# Patient Record
Sex: Male | Born: 1995 | Race: Black or African American | Hispanic: No | Marital: Single
Health system: Southern US, Community
[De-identification: ages and names within clinical notes are randomized; demographics above are authoritative.]

## PROBLEM LIST (undated history)

## (undated) DIAGNOSIS — M928 Other specified juvenile osteochondrosis: Secondary | ICD-10-CM

## (undated) HISTORY — DX: Other specified juvenile osteochondrosis: M92.8

---
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
## 2014-01-03 ENCOUNTER — Encounter (HOSPITAL_COMMUNITY): Payer: Self-pay | Admitting: Emergency Medicine

## 2014-01-03 ENCOUNTER — Emergency Department (HOSPITAL_COMMUNITY)
Admission: EM | Admit: 2014-01-03 | Discharge: 2014-01-03 | Disposition: A | Discharge status: Home / Self Care | Payer: Medicaid Other | Attending: Emergency Medicine | Admitting: Emergency Medicine

## 2014-01-03 DIAGNOSIS — S01511A Laceration without foreign body of lip, initial encounter: Secondary | ICD-10-CM

## 2014-01-03 DIAGNOSIS — M5412 Radiculopathy, cervical region: Secondary | ICD-10-CM | POA: Insufficient documentation

## 2014-01-03 DIAGNOSIS — S01501A Unspecified open wound of lip, initial encounter: Secondary | ICD-10-CM | POA: Insufficient documentation

## 2014-01-03 DIAGNOSIS — Y9367 Activity, basketball: Secondary | ICD-10-CM | POA: Insufficient documentation

## 2014-01-03 DIAGNOSIS — Y92838 Other recreation area as the place of occurrence of the external cause: Secondary | ICD-10-CM

## 2014-01-03 DIAGNOSIS — W219XXA Striking against or struck by unspecified sports equipment, initial encounter: Secondary | ICD-10-CM | POA: Insufficient documentation

## 2014-01-03 DIAGNOSIS — Y9239 Other specified sports and athletic area as the place of occurrence of the external cause: Secondary | ICD-10-CM | POA: Insufficient documentation

## 2014-01-03 DIAGNOSIS — F17200 Nicotine dependence, unspecified, uncomplicated: Secondary | ICD-10-CM | POA: Insufficient documentation

## 2014-01-03 MED ORDER — DOUBLE ANTIBIOTIC 500-10000 UNIT/GM EX OINT: TOPICAL_OINTMENT | Freq: Once | CUTANEOUS | Status: DC

## 2014-01-03 MED ORDER — BACITRACIN ZINC 500 UNIT/GM EX OINT
TOPICAL_OINTMENT | CUTANEOUS | Status: AC
  Administered 2014-01-03: 1
  Filled 2014-01-03: qty 0.9

## 2014-01-03 NOTE — ED Provider Notes (Signed)
CSN: [id number]     Arrival date & time 01/03/14  1620 History   First MD Initiated Contact with Patient 01/03/14 1630     Chief Complaint  Patient presents with  . Laceration     (Consider location/radiation/quality/duration/timing/severity/associated sxs/prior Treatment) Patient is a 18 y.o. male presenting with skin laceration. The history is provided by the patient.  Laceration Location:  Mouth Mouth laceration location:  Lower inner lip Length (cm):  1.2 cm Quality: jagged   Bleeding: controlled   Time since incident:  1 hour Injury mechanism: teeth. Pain details:    Quality:  Aching   Severity:  Mild   Timing:  Constant   Progression:  Unchanged Foreign body present:  No foreign bodies Worsened by:  Nothing tried Tetanus status:  Up to date  Zachary Cain is a 18 y.o. male who presents to the ED for a laceration to the lower lip. He was playing basketball and hit his lip against another player's head and patient's teeth cut bottom lip. There was bleeding initially but it stopped. There is some swelling. No injury to the teeth, no head injury, no LOC, no n/v. He does have a little discomfort in his lower back but did not get hit in his back.   Past Medical History  Diagnosis Date  . Osgood-Schlatter/osteochondroses    History reviewed. No pertinent past surgical history. No family history on file. History  Substance Use Topics  . Smoking status: Light Tobacco Smoker    Types: Cigarettes  . Smokeless tobacco: Not on file     Comment: somes occasionally  . Alcohol Use: No    Review of Systems Negative except as stated in HPI   Allergies  Review of patient's allergies indicates no known allergies.  Home Medications  No current outpatient prescriptions on file. BP 121/70  Pulse 69  Temp(Src) 98 F (36.7 C)  Resp 18  Ht 5\' 10"  (1.778 m)  Wt 147 lb (66.679 kg)  BMI 21.09 kg/m2  SpO2 98% Physical Exam  Nursing note and vitals reviewed. Constitutional: He  is oriented to person, place, and time. He appears well-developed and well-nourished. No distress.  HENT:  Right Ear: Tympanic membrane normal.  Left Ear: Tympanic membrane normal.  Mouth/Throat: Uvula is midline, oropharynx is clear and moist and mucous membranes are normal. Normal dentition. Lacerations present.  There is a 1.2 cm laceration to the inside of the lower lip that extends to the outside. The laceration does not involve the vermilion border.   Eyes: Conjunctivae and EOM are normal. Pupils are equal, round, and reactive to light.  Neck: Normal range of motion. Neck supple. No spinous process tenderness and no muscular tenderness present.  Cardiovascular: Normal rate.   Pulmonary/Chest: Effort normal.  Abdominal: Soft.  Musculoskeletal: Normal range of motion.  Minimal tenderness right lower lumbar area with palpation.   Neurological: He is alert and oriented to person, place, and time. He has normal strength. Gait normal.  Skin: Skin is warm and dry.  Psychiatric: He has a normal mood and affect. His behavior is normal.   I discussed with the patient's grandmother that sutures would most likely not help the area since it is mostly tissue damage and no real area to pull together. She is concerned that it may do better with sutures.  ED Course: dr. Adriana Simasook in to examine the patient.   Procedures Dr. Adriana Simasook discussed with the patient's grandmother type of injury and suggested bacitracin ointment and ice. Patient's  grandmother agrees with Dr. Adriana Simas.   MDM  18 y.o. male with laceration to the lower lip s/p sport injury. Wound cleaned, bacitracin ointment applied to outside part of lip. Stable for discharge. Will not suture the lip. Discussed with the patient and his grandmother plan of care and all questioned fully answered. He will return if any problems arise.     [facility] Orlene Och, [place] 01/03/14 [phone]

## 2014-01-03 NOTE — ED Notes (Signed)
Laceration to lower lip.

## 2014-01-03 NOTE — ED Notes (Signed)
Split bottom lip while playing basketball about 30 mins ago.

## 2014-01-03 NOTE — ED Provider Notes (Signed)
Medical screening examination/treatment/procedure(s) were conducted as a shared visit with non-physician practitioner(s) and myself.  I personally evaluated the patient during the encounter.   EKG Interpretation None     Lower lip laceration macerated.  No suture necessary  Donnetta HutchingBrian Timoteo Carreiro, MD 01/03/14 1715

## 2014-01-09 IMAGING — CR DG CHEST 2V
2 series · 2 of 2 positions shown · non-contrast
Comparison: Chest and rib films 07/29/2010.

CLINICAL DATA: Cough.

CHEST - 2 VIEW

[view not recorded (1 of 2)]
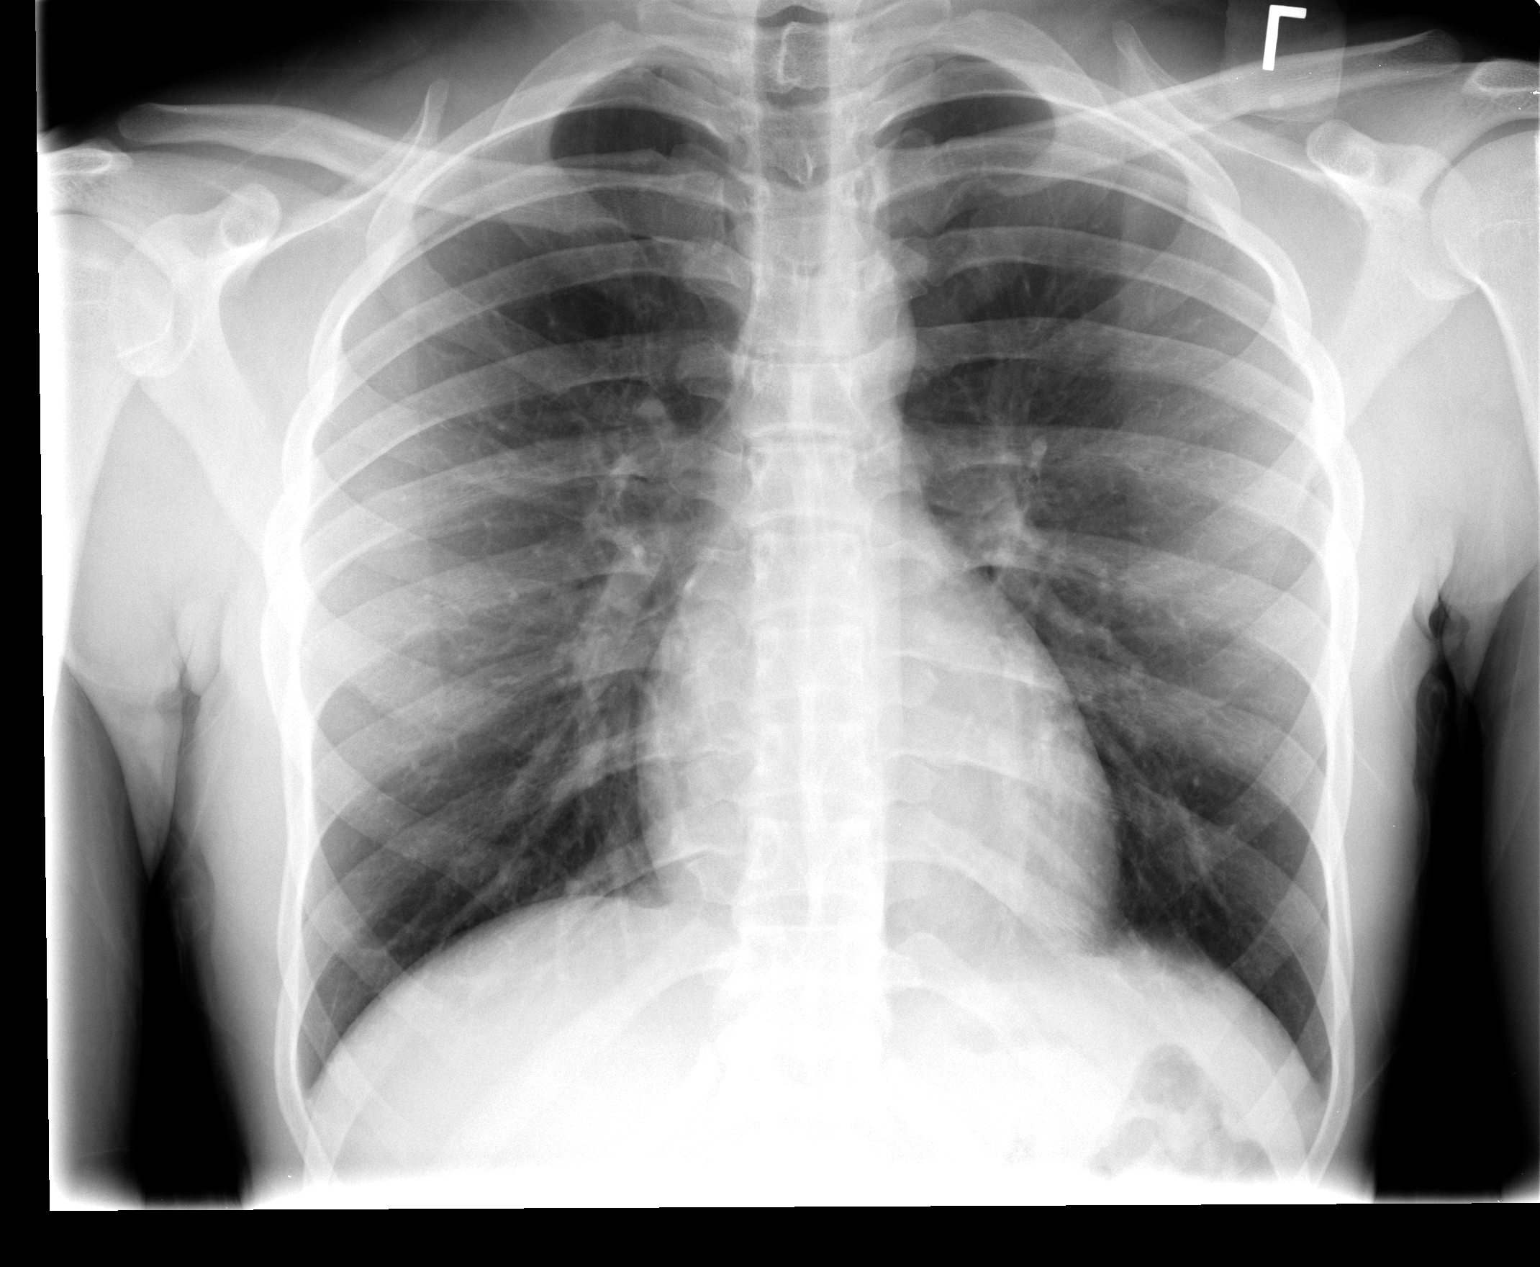

[view not recorded (2 of 2)]
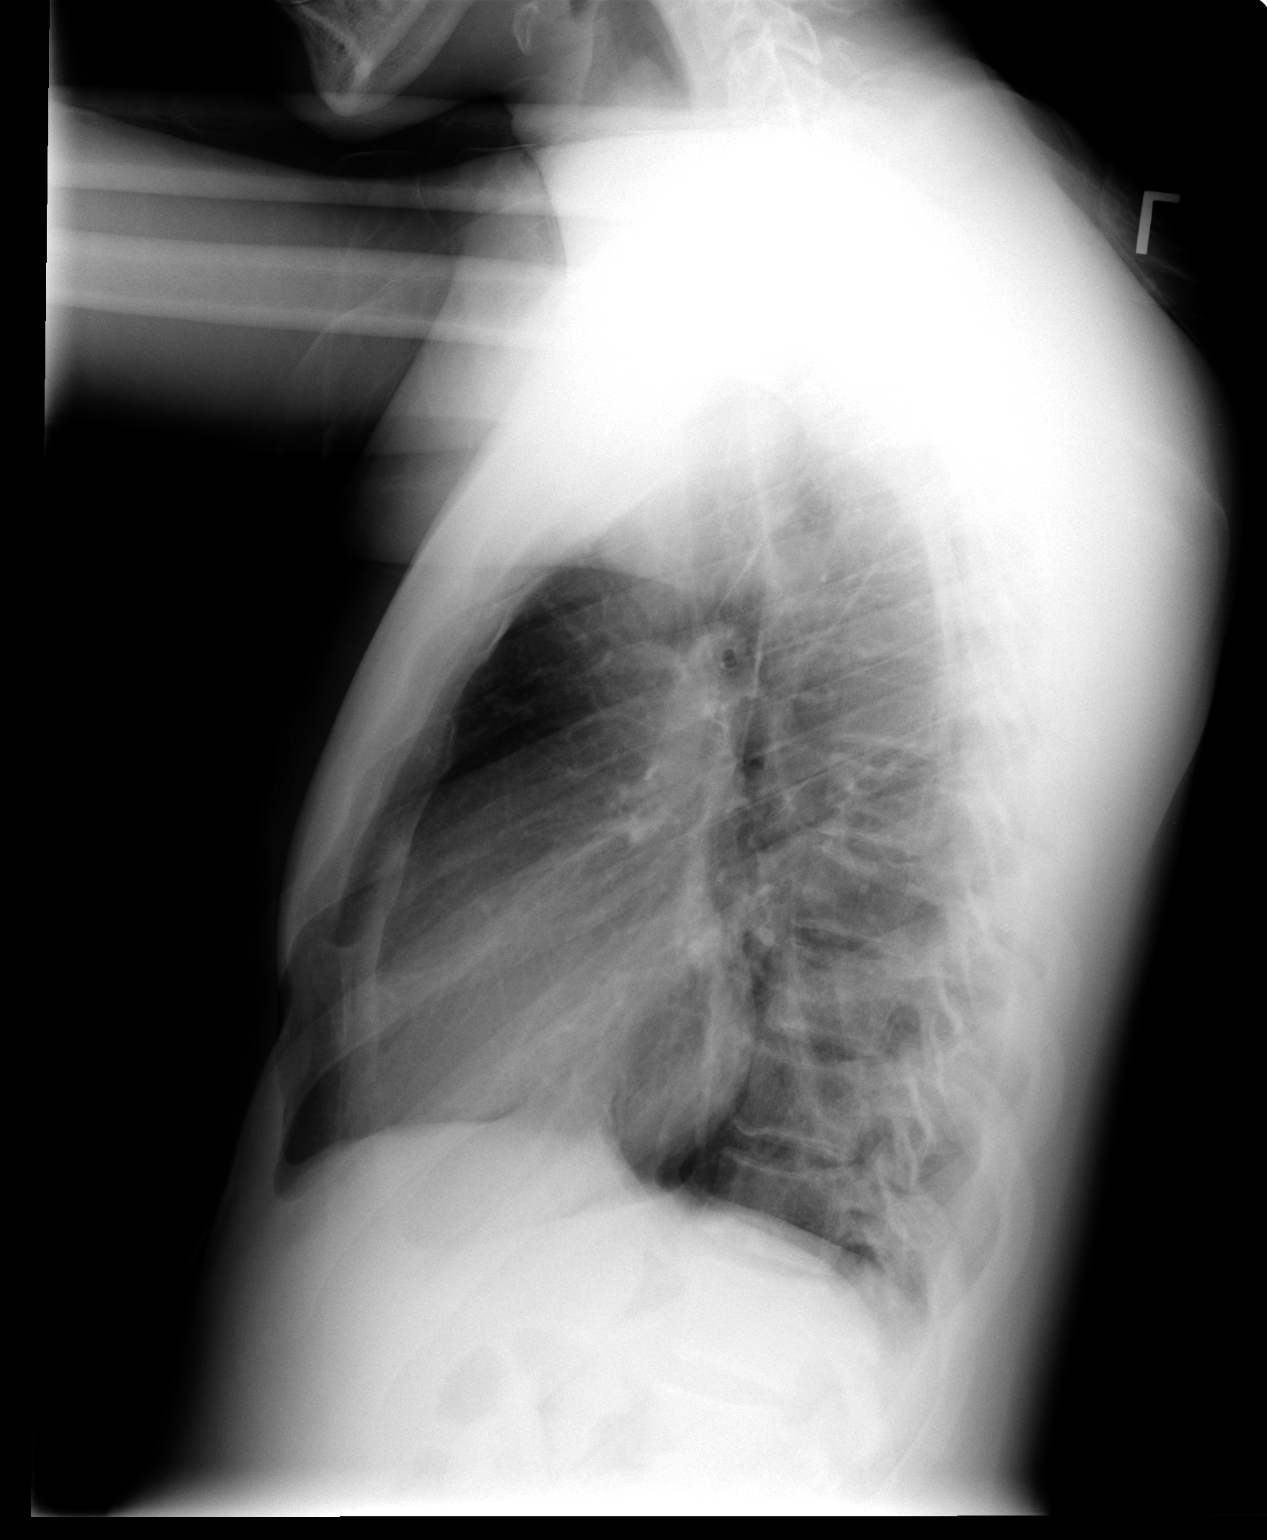

[2 of 2 positions shown; findings below may reference images not displayed]

FINDINGS: The heart size is normal.  No acute bone or soft tissue
abnormality is present.
IMPRESSION: Negative chest.

## 2015-03-04 ENCOUNTER — Emergency Department (HOSPITAL_COMMUNITY): Payer: Medicaid Other

## 2015-03-04 ENCOUNTER — Emergency Department (HOSPITAL_COMMUNITY)
Admission: EM | Admit: 2015-03-04 | Discharge: 2015-03-04 | Disposition: A | Discharge status: Home / Self Care | Payer: Medicaid Other | Attending: Emergency Medicine | Admitting: Emergency Medicine

## 2015-03-04 ENCOUNTER — Encounter (HOSPITAL_COMMUNITY): Payer: Self-pay | Admitting: Emergency Medicine

## 2015-03-04 DIAGNOSIS — R51 Headache: Secondary | ICD-10-CM | POA: Insufficient documentation

## 2015-03-04 DIAGNOSIS — R0789 Other chest pain: Secondary | ICD-10-CM

## 2015-03-04 DIAGNOSIS — R059 Cough, unspecified: Secondary | ICD-10-CM

## 2015-03-04 DIAGNOSIS — R369 Urethral discharge, unspecified: Secondary | ICD-10-CM

## 2015-03-04 DIAGNOSIS — R112 Nausea with vomiting, unspecified: Secondary | ICD-10-CM

## 2015-03-04 DIAGNOSIS — Z8739 Personal history of other diseases of the musculoskeletal system and connective tissue: Secondary | ICD-10-CM | POA: Insufficient documentation

## 2015-03-04 DIAGNOSIS — R05 Cough: Secondary | ICD-10-CM | POA: Insufficient documentation

## 2015-03-04 DIAGNOSIS — Z720 Tobacco use: Secondary | ICD-10-CM | POA: Insufficient documentation

## 2015-03-04 DIAGNOSIS — R519 Headache, unspecified: Secondary | ICD-10-CM

## 2015-03-04 LAB — URINALYSIS, ROUTINE W REFLEX MICROSCOPIC
Bilirubin Urine: NEGATIVE
Glucose, UA: NEGATIVE mg/dL
Hgb urine dipstick: NEGATIVE
KETONES UR: NEGATIVE mg/dL
NITRITE: NEGATIVE
PROTEIN: NEGATIVE mg/dL
Specific Gravity, Urine: >1.03 — ABNORMAL HIGH (ref 1.005–1.030)
UROBILINOGEN UA: 0.2 mg/dL (ref 0.0–1.0)
pH: 6 (ref 5.0–8.0)

## 2015-03-04 LAB — URINE MICROSCOPIC-ADD ON

## 2015-03-04 MED ORDER — KETOROLAC TROMETHAMINE 30 MG/ML IJ SOLN
60.0000 mg | Freq: Once | INTRAMUSCULAR | Status: AC
  Filled 2015-03-04: qty 2

## 2015-03-04 MED ORDER — CEPHALEXIN 500 MG PO CAPS: 500.0000 mg | ORAL_CAPSULE | Freq: Two times a day (BID) | ORAL | Status: DC

## 2015-03-04 MED ORDER — IBUPROFEN 800 MG PO TABS: 800.0000 mg | ORAL_TABLET | Freq: Three times a day (TID) | ORAL | Status: DC | PRN

## 2015-03-04 MED ORDER — LIDOCAINE HCL (PF) 1 % IJ SOLN
INTRAMUSCULAR | Status: AC
  Administered 2015-03-04: 5 mL
  Filled 2015-03-04: qty 5

## 2015-03-04 MED ORDER — KETOROLAC TROMETHAMINE 60 MG/2ML IM SOLN
60.0000 mg | Freq: Once | INTRAMUSCULAR | Status: AC
  Administered 2015-03-04: 60 mg via INTRAMUSCULAR

## 2015-03-04 MED ORDER — CEFTRIAXONE SODIUM 250 MG IJ SOLR
250.0000 mg | Freq: Once | INTRAMUSCULAR | Status: AC
  Administered 2015-03-04: 250 mg via INTRAMUSCULAR
  Filled 2015-03-04: qty 250

## 2015-03-04 MED ORDER — ONDANSETRON 4 MG PO TBDP: 4.0000 mg | ORAL_TABLET | Freq: Three times a day (TID) | ORAL | Status: DC | PRN

## 2015-03-04 MED ORDER — AZITHROMYCIN 250 MG PO TABS
1000.0000 mg | ORAL_TABLET | Freq: Once | ORAL | Status: AC
  Administered 2015-03-04: 1000 mg via ORAL
  Filled 2015-03-04: qty 4

## 2015-03-04 MED ORDER — ONDANSETRON 8 MG PO TBDP
8.0000 mg | ORAL_TABLET | Freq: Once | ORAL | Status: AC
  Administered 2015-03-04: 8 mg via ORAL
  Filled 2015-03-04: qty 1

## 2015-03-04 NOTE — ED Provider Notes (Addendum)
TIME SEEN: 4:20 AM  CHIEF COMPLAINT: Headache, cough, chest pain, hematuria, penile discharge  HPI: Pt is a 19 y.o. male with no significant past history who presents to the emergency department with multiple complaints. Reported the past several days he has had cough with yellow sputum production that is not turn into a dry cough. Reports he has had subjective fevers. Is having chest pain that is worse with this coughing. Chest pain is diffuse without radiation. Described as moderate in nature. Minimal chest pain currently. No shortness of breath. Reports that he started having a left-sided headache what work today. Did not take any medication for this because he states he had one episode of vomiting at work. Has not had head injury. No neck pain or neck stiffness. No numbness, tingling or focal weakness. Not on anticoagulation. Also reports for the past several days he has noticed blood in his urine and penile discharge. States he, his girlfriend was cheating on him. He does not have a history of STDs. Denies dysuria. No flank pain. No testicular pain or swelling. No scrotal masses.    ROS: See HPI Constitutional: Subjective fever  Eyes: no drainage  ENT: no runny nose   Cardiovascular:   chest pain  Resp: no SOB  GI: Vomiting, no diarrhea, no abdominal pain GU: no dysuria Integumentary: no rash  Allergy: no hives  Musculoskeletal: no leg swelling  Neurological: no slurred speech ROS otherwise negative  PAST MEDICAL HISTORY/PAST SURGICAL HISTORY:  Past Medical History  Diagnosis Date  . Osgood-Schlatter/osteochondroses     MEDICATIONS:  Prior to Admission medications   Not on File    ALLERGIES:  No Known Allergies  SOCIAL HISTORY:  History  Substance Use Topics  . Smoking status: Light Tobacco Smoker    Types: Cigarettes  . Smokeless tobacco: Not on file     Comment: somes occasionally  . Alcohol Use: No    FAMILY HISTORY: History reviewed. No pertinent family  history.  EXAM: BP 127/81 mmHg  Pulse 57  Temp(Src) 98.3 F (36.8 C)  Resp 18  Ht 5\' 7"  (1.702 m)  Wt 140 lb (63.504 kg)  BMI 21.92 kg/m2  SpO2 97% CONSTITUTIONAL: Alert and oriented and responds appropriately to questions. Well-appearing; well-nourished, well-hydrated, nontoxic, no distress HEAD: Normocephalic EYES: Conjunctivae clear, PERRL ENT: normal nose; no rhinorrhea; moist mucous membranes; pharynx without lesions noted NECK: Supple, no meningismus, no LAD  CARD: RRR; S1 and S2 appreciated; no murmurs, no clicks, no rubs, no gallops RESP: Normal chest excursion without splinting or tachypnea; breath sounds clear and equal bilaterally; no wheezes, no rhonchi, no rales, no hypoxia or respiratory distress, speaking full sentences, anterior chest wall is tender to palpation without crepitus or ecchymosis or deformity ABD/GI: Normal bowel sounds; non-distended; soft, non-tender, no rebound, no guarding, no peritoneal signs GU:  Normal external genitalia, circumcised male, small amount of clear discharge coming from the urethral meatus but no blood or purulence, 2+ femoral pulses bilaterally, no testicular pain or masses, no scrotal masses, no hernia, no perineal warmth or erythema or crepitus BACK:  The back appears normal and is non-tender to palpation, there is no CVA tenderness EXT: Normal ROM in all joints; non-tender to palpation; no edema; normal capillary refill; no cyanosis, no calf tenderness or swelling    SKIN: Normal color for age and race; warm NEURO: Moves all extremities equally, sensation to light touch intact diffusely, cranial nerves II through XII intact PSYCH: The patient's mood and manner are appropriate. Grooming  and personal hygiene are appropriate.  MEDICAL DECISION MAKING: Patient here with multiple complaints. EKG shows no ischemic changes. Will give dose of Toradol in the ED. We'll also obtain chest x-ray. Suspect chest pain is musculoskeletal in nature from  his cough which is likely caused by a viral illness.  Also complaining of headache. He is neurologically intact, afebrile, well-appearing, nontoxic. I do not feel he needs emergent head imaging. We'll reassess after Toradol.   Also complains of one episode of nonbloody, nonbilious vomiting. No diarrhea. No abdominal pain. We'll give Zofran. He appears well hydrated on exam.   Also complaining of hematuria, penile discharge. Will check for gonorrhea, Chlamydia and Trichomonas. Have recommended HIV and syphilis testing which he declines in the emergency department today. Will treat empirically for gonorrhea and chlamydia. Urinalysis pending.  ED PROGRESS: Chest x-ray clear. Headache improved. Able to tolerate taking fluids without further vomiting. Urine shows leukocytes and bacteria but I suspect this may be due to an STD. Urine culture is pending as well as gonorrhea/chlamydia test but he has been treated today with ceftriaxone and azithromycin empirically. Will discharge on Keflex given he is having penile discharge and hematuria in case this is a urinary tract infection. Have again advised him to follow-up as an outpatient for HIV and syphilis testing. Discussed return precautions. He verbalizes understanding and is comfortable with plan.     EKG Interpretation  Date/Time:  Tuesday Mar 04 2015 04:30:19 EDT Ventricular Rate:  56 PR Interval:  181 QRS Duration: 84 QT Interval:  396 QTC Calculation: 382 R Axis:   56 Text Interpretation:  Sinus rhythm No old tracing to compare Confirmed by WARD,  DO, KRISTEN [phone]) on 03/04/2015 4:51:26 AM          Layla Maw Ward, DO 03/04/15 0534  Layla Maw Ward, DO 03/04/15 0534  Layla Maw Ward, DO 03/04/15 6045

## 2015-03-04 NOTE — Discharge Instructions (Signed)
You have been treated for gonorrhea and chlamydia today.  Please follow-up with your primary care provider for HIV and syphilis testing.   Chest Wall Pain Chest wall pain is pain in or around the bones and muscles of your chest. It may take up to 6 weeks to get better. It may take longer if you must stay physically active in your work and activities.  CAUSES  Chest wall pain may happen on its own. However, it may be caused by:  A viral illness like the flu.  Injury.  Coughing.  Exercise.  Arthritis.  Fibromyalgia.  Shingles. HOME CARE INSTRUCTIONS   Avoid overtiring physical activity. Try not to strain or perform activities that cause pain. This includes any activities using your chest or your abdominal and side muscles, especially if heavy weights are used.  Put ice on the sore area.  Put ice in a plastic bag.  Place a towel between your skin and the bag.  Leave the ice on for 15-20 minutes per hour while awake for the first 2 days.  Only take over-the-counter or prescription medicines for pain, discomfort, or fever as directed by your caregiver. SEEK IMMEDIATE MEDICAL CARE IF:   Your pain increases, or you are very uncomfortable.  You have a fever.  Your chest pain becomes worse.  You have new, unexplained symptoms.  You have nausea or vomiting.  You feel sweaty or lightheaded.  You have a cough with phlegm (sputum), or you cough up blood. MAKE SURE YOU:   Understand these instructions.  Will watch your condition.  Will get help right away if you are not doing well or get worse. Document Released: 09/20/2005 Document Revised: 12/13/2011 Document Reviewed: 05/17/2011 [facility] Patient Information 2015 [place], [place]. This information is not intended to replace advice given to you by your health care provider. Make sure you discuss any questions you have with your health care provider.  General Headache Without Cause A headache is pain or discomfort felt  around the head or neck area. The specific cause of a headache may not be found. There are many causes and types of headaches. A few common ones are:  Tension headaches.  Migraine headaches.  Cluster headaches.  Chronic daily headaches. HOME CARE INSTRUCTIONS   Keep all follow-up appointments with your caregiver or any specialist referral.  Only take over-the-counter or prescription medicines for pain or discomfort as directed by your caregiver.  Lie down in a dark, quiet room when you have a headache.  Keep a headache journal to find out what may trigger your migraine headaches. For example, write down:  What you eat and drink.  How much sleep you get.  Any change to your diet or medicines.  Try massage or other relaxation techniques.  Put ice packs or heat on the head and neck. Use these 3 to 4 times per day for 15 to 20 minutes each time, or as needed.  Limit stress.  Sit up straight, and do not tense your muscles.  Quit smoking if you smoke.  Limit alcohol use.  Decrease the amount of caffeine you drink, or stop drinking caffeine.  Eat and sleep on a regular schedule.  Get 7 to 9 hours of sleep, or as recommended by your caregiver.  Keep lights dim if bright lights bother you and make your headaches worse. SEEK MEDICAL CARE IF:   You have problems with the medicines you were prescribed.  Your medicines are not working.  You have a change from  the usual headache.  You have nausea or vomiting. SEEK IMMEDIATE MEDICAL CARE IF:   Your headache becomes severe.  You have a fever.  You have a stiff neck.  You have loss of vision.  You have muscular weakness or loss of muscle control.  You start losing your balance or have trouble walking.  You feel faint or pass out.  You have severe symptoms that are different from your first symptoms. MAKE SURE YOU:   Understand these instructions.  Will watch your condition.  Will get help right away if you  are not doing well or get worse. Document Released: 09/20/2005 Document Revised: 12/13/2011 Document Reviewed: 10/06/2011 [facility] Patient Information 2015 [place], [place]. This information is not intended to replace advice given to you by your health care provider. Make sure you discuss any questions you have with your health care provider.   Sexually Transmitted Disease A sexually transmitted disease (STD) is a disease or infection that may be passed (transmitted) from person to person, usually during sexual activity. This may happen by way of saliva, semen, blood, vaginal mucus, or urine. Common STDs include:   Gonorrhea.   Chlamydia.   Syphilis.   HIV and AIDS.   Genital herpes.   Hepatitis B and C.   Trichomonas.   Human papillomavirus (HPV).   Pubic lice.   Scabies.  Mites.  Bacterial vaginosis. WHAT ARE CAUSES OF STDs? An STD may be caused by bacteria, a virus, or parasites. STDs are often transmitted during sexual activity if one person is infected. However, they may also be transmitted through nonsexual means. STDs may be transmitted after:   Sexual intercourse with an infected person.   Sharing sex toys with an infected person.   Sharing needles with an infected person or using unclean piercing or tattoo needles.  Having intimate contact with the genitals, mouth, or rectal areas of an infected person.   Exposure to infected fluids during birth. WHAT ARE THE SIGNS AND SYMPTOMS OF STDs? Different STDs have different symptoms. Some people may not have any symptoms. If symptoms are present, they may include:   Painful or bloody urination.   Pain in the pelvis, abdomen, vagina, anus, throat, or eyes.   A skin rash, itching, or irritation.  Growths, ulcerations, blisters, or sores in the genital and anal areas.  Abnormal vaginal discharge with or without bad odor.   Penile discharge in men.   Fever.   Pain or bleeding during sexual  intercourse.   Swollen glands in the groin area.   Yellow skin and eyes (jaundice). This is seen with hepatitis.   Swollen testicles.  Infertility.  Sores and blisters in the mouth. HOW ARE STDs DIAGNOSED? To make a diagnosis, your health care provider may:   Take a medical history.   Perform a physical exam.   Take a sample of any discharge to examine.  Swab the throat, cervix, opening to the penis, rectum, or vagina for testing.  Test a sample of your first morning urine.   Perform blood tests.   Perform a Pap test, if this applies.   Perform a colposcopy.   Perform a laparoscopy.  HOW ARE STDs TREATED? Treatment depends on the STD. Some STDs may be treated but not cured.   Chlamydia, gonorrhea, trichomonas, and syphilis can be cured with antibiotic medicine.   Genital herpes, hepatitis, and HIV can be treated, but not cured, with prescribed medicines. The medicines lessen symptoms.   Genital warts from HPV can be  treated with medicine or by freezing, burning (electrocautery), or surgery. Warts may come back.   HPV cannot be cured with medicine or surgery. However, abnormal areas may be removed from the cervix, vagina, or vulva.   If your diagnosis is confirmed, your recent sexual partners need treatment. This is true even if they are symptom-free or have a negative culture or evaluation. They should not have sex until their health care providers say it is okay. HOW CAN I REDUCE MY RISK OF GETTING AN STD? Take these steps to reduce your risk of getting an STD:  Use latex condoms, dental dams, and water-soluble lubricants during sexual activity. Do not use petroleum jelly or oils.  Avoid having multiple sex partners.  Do not have sex with someone who has other sex partners.  Do not have sex with anyone you do not know or who is at high risk for an STD.  Avoid risky sex practices that can break your skin.  Do not have sex if you have open sores  on your mouth or skin.  Avoid drinking too much alcohol or taking illegal drugs. Alcohol and drugs can affect your judgment and put you in a vulnerable position.  Avoid engaging in oral and anal sex acts.  Get vaccinated for HPV and hepatitis. If you have not received these vaccines in the past, talk to your health care provider about whether one or both might be right for you.   If you are at risk of being infected with HIV, it is recommended that you take a prescription medicine daily to prevent HIV infection. This is called pre-exposure prophylaxis (PrEP). You are considered at risk if:  You are a man who has sex with other men (MSM).  You are a heterosexual man or woman and are sexually active with more than one partner.  You take drugs by injection.  You are sexually active with a partner who has HIV.  Talk with your health care provider about whether you are at high risk of being infected with HIV. If you choose to begin PrEP, you should first be tested for HIV. You should then be tested every 3 months for as long as you are taking PrEP.  WHAT SHOULD I DO IF I THINK I HAVE AN STD?  See your health care provider.   Tell your sexual partner(s). They should be tested and treated for any STDs.  Do not have sex until your health care provider says it is okay. WHEN SHOULD I GET IMMEDIATE MEDICAL CARE? Contact your health care provider right away if:   You have severe abdominal pain.  You are a man and notice swelling or pain in your testicles.  You are a woman and notice swelling or pain in your vagina. Document Released: 12/11/2002 Document Revised: 09/25/2013 Document Reviewed: 04/10/2013 [facility] Patient Information 2015 [place], [place]. This information is not intended to replace advice given to you by your health care provider. Make sure you discuss any questions you have with your health care provider.

## 2015-03-04 NOTE — ED Notes (Signed)
Patient placed on cardiac monitor and EKG done and given to EDP 

## 2015-03-04 NOTE — ED Notes (Signed)
Pt c/o cough that started over weekend and headache started 5/30

## 2015-03-05 LAB — URINE CULTURE
COLONY COUNT: NO GROWTH
CULTURE: NO GROWTH

## 2015-03-05 LAB — GC/CHLAMYDIA PROBE AMP (~~LOC~~) NOT AT ARMC
Chlamydia: POSITIVE — AB
NEISSERIA GONORRHEA: NEGATIVE

## 2015-03-06 ENCOUNTER — Telehealth (HOSPITAL_BASED_OUTPATIENT_CLINIC_OR_DEPARTMENT_OTHER): Payer: Self-pay | Admitting: Emergency Medicine

## 2015-03-06 NOTE — Telephone Encounter (Signed)
Notified of + chlamydia, educated re abstinence and notification of sexual partner(s)

## 2015-05-16 DIAGNOSIS — Z0289 Encounter for other administrative examinations: Secondary | ICD-10-CM

## 2015-06-21 ENCOUNTER — Encounter (HOSPITAL_COMMUNITY): Payer: Self-pay | Admitting: Emergency Medicine

## 2015-06-21 ENCOUNTER — Emergency Department (HOSPITAL_COMMUNITY)
Admission: EM | Admit: 2015-06-21 | Discharge: 2015-06-21 | Disposition: A | Discharge status: Home / Self Care | Payer: Medicaid Other | Attending: Emergency Medicine | Admitting: Emergency Medicine

## 2015-06-21 DIAGNOSIS — Z87891 Personal history of nicotine dependence: Secondary | ICD-10-CM | POA: Insufficient documentation

## 2015-06-21 DIAGNOSIS — R21 Rash and other nonspecific skin eruption: Secondary | ICD-10-CM | POA: Insufficient documentation

## 2015-06-21 DIAGNOSIS — Z8739 Personal history of other diseases of the musculoskeletal system and connective tissue: Secondary | ICD-10-CM | POA: Insufficient documentation

## 2015-06-21 MED ORDER — AZITHROMYCIN 250 MG PO TABS
1000.0000 mg | ORAL_TABLET | Freq: Once | ORAL | Status: AC
  Administered 2015-06-21: 1000 mg via ORAL
  Filled 2015-06-21: qty 4

## 2015-06-21 MED ORDER — LIDOCAINE HCL (PF) 1 % IJ SOLN
INTRAMUSCULAR | Status: AC
  Administered 2015-06-21: 5 mL
  Filled 2015-06-21: qty 5

## 2015-06-21 MED ORDER — CEFTRIAXONE SODIUM 250 MG IJ SOLR
250.0000 mg | Freq: Once | INTRAMUSCULAR | Status: AC
  Administered 2015-06-21: 250 mg via INTRAMUSCULAR
  Filled 2015-06-21: qty 250

## 2015-06-21 MED ORDER — LIDOCAINE HCL (PF) 1 % IJ SOLN: 5.0000 mL | Freq: Once | INTRAMUSCULAR | Status: AC

## 2015-06-21 NOTE — Discharge Instructions (Signed)
Take Benadryl as needed for itching. Follow up with Dr. Margo Aye if symptoms persist. If you make an appointment as for the [facility] office.

## 2015-06-21 NOTE — ED Notes (Signed)
PT c/o rash to lower abdomen and inner thighs x1 week.

## 2015-06-21 NOTE — ED Notes (Signed)
Pt made aware to return if symptoms worsen or if any life threatening symptoms occur.   

## 2015-06-21 NOTE — ED Provider Notes (Signed)
CSN: [id number]     Arrival date & time 06/21/15  1614 History   First MD Initiated Contact with Patient 06/21/15 1632     Chief Complaint  Patient presents with  . Rash     (Consider location/radiation/quality/duration/timing/severity/associated sxs/prior Treatment) Patient is a 19 y.o. male presenting with rash. The history is provided by the patient.  Rash Location:  Torso and leg Torso rash location:  Abd RUQ and abd RLQ Leg rash location:  L leg and R leg Severity:  Moderate Onset quality:  Gradual Duration:  1 week Timing:  Constant Progression:  Worsening Chronicity:  New Relieved by:  None tried Worsened by:  Heat Ineffective treatments:  None tried  Zachary Cain is a 19 y.o. male who presents to the ED with a rash to the lower abdomen and inner thighs that started a week ago and has gotten worse. Last sexual intercourse 2 months ago. He does report hx of Chlamydia but was treated. He denies any new exposures to clothes, detergents, soap or anyone with similar rash. Patient denies travel, camping or being in any places different than usual. He denies dysuria or frequency.   He complains of itching.   Past Medical History  Diagnosis Date  . Osgood-Schlatter/osteochondroses    History reviewed. No pertinent past surgical history. History reviewed. No pertinent family history. Social History  Substance Use Topics  . Smoking status: Former Smoker    Types: Cigarettes    Quit date: 06/20/2014  . Smokeless tobacco: None     Comment: somes occasionally  . Alcohol Use: No    Review of Systems  Skin: Positive for rash.  all other systems negative    Allergies  Review of patient's allergies indicates no known allergies.  Home Medications   Prior to Admission medications   Not on File   BP 125/74 mmHg  Pulse 63  Temp(Src) 98.1 F (36.7 C) (Oral)  Resp 18  Ht  (1.778 m)  Wt 153 lb (69.4 kg)  BMI 21.95 kg/m2  SpO2 98% Physical Exam  Constitutional:  He is oriented to person, place, and time. He appears well-developed and well-nourished. No distress.  HENT:  Head: Normocephalic and atraumatic.  Eyes: EOM are normal.  Neck: Normal range of motion. Neck supple.  Cardiovascular: Normal rate.   Pulmonary/Chest: Effort normal.  Abdominal:  See skin exam  Musculoskeletal: Normal range of motion.  Neurological: He is alert and oriented to person, place, and time. No cranial nerve deficit.  Skin: Skin is warm and dry.  Maculopapular erythematous rash noted to the lower abdomen, pubic area and inner thighs. Few areas noted on penis.  No rash noted on hands or feet.   Psychiatric: He has a normal mood and affect. His behavior is normal.  Nursing note and vitals reviewed.   ED Course  Procedures Dr. Jodi Mourning in to examine the patient. MDM  19 y.o. male with rash and itching that has gotten worse over the past week. Cultures for GC and Chlamydia pending. HIV and RPR pending. Discussed with the patient follow up with dermatology if symptoms persist. Rocephin 250 mg IM and Zithromax 1 gram PO given prior to d/c.   Final diagnoses:  Rash and nonspecific skin eruption       Janne Napoleon, NP 06/21/15 1957  Blane Ohara, MD 06/22/15 [phone]

## 2015-06-22 LAB — HIV ANTIBODY (ROUTINE TESTING W REFLEX): HIV SCREEN 4TH GENERATION: NONREACTIVE

## 2015-06-22 LAB — RPR: RPR: NONREACTIVE

## 2015-06-23 LAB — GC/CHLAMYDIA PROBE AMP (~~LOC~~) NOT AT ARMC
CHLAMYDIA, DNA PROBE: NEGATIVE
NEISSERIA GONORRHEA: NEGATIVE

## 2015-07-09 ENCOUNTER — Encounter: Payer: Self-pay | Admitting: Physician Assistant

## 2015-07-09 ENCOUNTER — Ambulatory Visit: Payer: Self-pay | Admitting: Physician Assistant

## 2015-07-09 VITALS — BP 122/80 | HR 55 | Temp 97.7°F | Ht 68.24 in | Wt 148.8 lb

## 2015-07-09 DIAGNOSIS — Z131 Encounter for screening for diabetes mellitus: Secondary | ICD-10-CM

## 2015-07-09 DIAGNOSIS — R21 Rash and other nonspecific skin eruption: Secondary | ICD-10-CM

## 2015-07-09 LAB — GLUCOSE, POCT (MANUAL RESULT ENTRY): POC GLUCOSE: 130 mg/dL — AB (ref 70–99)

## 2015-07-09 MED ORDER — PREDNISONE 10 MG PO TABS: ORAL_TABLET | ORAL | Status: AC

## 2015-07-09 NOTE — Progress Notes (Signed)
Subjective:     Patient ID: Zachary Cain, male   DOB: [birth year], 19 y.o.   MRN: [id number]  HPI  New pt Rash- pt noticed rash one month ago. Started on inner thighs. Is itchy and red. Now progressed to groin. Pt went to ER about 3 wk ago- was STD tested and treated. States no one ever called him with the results   Review of Systems  Constitutional: Negative for fever, chills, fatigue and unexpected weight change.  HENT: Negative for congestion, sneezing, sore throat, trouble swallowing and voice change.   Eyes: Negative for pain and redness.  Respiratory: Negative for cough and shortness of breath.   Cardiovascular: Negative for chest pain and leg swelling.  Gastrointestinal: Negative for vomiting, abdominal pain and blood in stool.  Genitourinary: Positive for genital sores. Negative for dysuria, discharge, penile swelling, scrotal swelling, difficulty urinating, penile pain and testicular pain.  Musculoskeletal: Negative for back pain, arthralgias and gait problem.  Skin: Positive for rash.  Neurological: Negative for seizures, syncope, speech difficulty and headaches.  Psychiatric/Behavioral: Negative for suicidal ideas, confusion and dysphoric mood. The patient is not nervous/anxious.        Objective:   Physical Exam  Constitutional: He is oriented to person, place, and time. He appears well-developed and well-nourished.  HENT:  Head: Normocephalic and atraumatic.  Eyes: Conjunctivae and EOM are normal. Pupils are equal, round, and reactive to light.  Neck: Neck supple. No tracheal deviation present. No thyromegaly present.  Cardiovascular: Normal rate, regular rhythm and normal heart sounds.   Pulmonary/Chest: Effort normal and breath sounds normal.  Abdominal: Soft. Bowel sounds are normal. He exhibits no mass. There is no tenderness.  Musculoskeletal: He exhibits no edema.  Lymphadenopathy:    He has no cervical adenopathy.  Neurological: He is alert and oriented to  person, place, and time.  Skin: Skin is warm and dry. Rash noted. Rash is maculopapular.     Maculopapular eruption on anterior thighs. 3-4 areas of open wound on penis- perhaps ulcerated lesion. No secondary infection  Psychiatric: He has a normal mood and affect. His behavior is normal. Thought content normal.  Vitals reviewed.      Assessment:     rash    Plan:     -Reviewed negative STD results with pt -rx prednisone. F/u 1 mo to recheck

## 2015-08-06 ENCOUNTER — Ambulatory Visit: Payer: Self-pay | Admitting: Physician Assistant

## 2015-08-07 ENCOUNTER — Encounter: Payer: Self-pay | Admitting: Physician Assistant

## 2015-08-07 ENCOUNTER — Ambulatory Visit: Payer: Self-pay | Admitting: Physician Assistant

## 2015-08-07 VITALS — BP 100/74 | HR 57 | Temp 98.1°F | Ht 68.25 in | Wt 153.2 lb

## 2015-08-07 DIAGNOSIS — R21 Rash and other nonspecific skin eruption: Secondary | ICD-10-CM

## 2015-08-07 NOTE — Progress Notes (Signed)
   BP 100/74 mmHg  Pulse 57  Temp(Src) 98.1 F (36.7 C)  Ht 5' 8.25" (1.734 m)  Wt 153 lb 3.2 oz (69.491 kg)  BMI 23.11 kg/m2  SpO2 99%   Subjective:    Patient ID: Zachary Cain, male    DOB: 02/03/1996, 19 y.o.   MRN: [id number]  HPI: Zachary Cain is a 19 y.o. male presenting on 08/07/2015 for Rash   HPI  Chief Complaint  Patient presents with  . Rash    pt states rash shows no improvement and is now showing on his hands.    Pt went to ER on 9/17 for rash. Was STD tested- RPR, HIV and GC/Chlamydia were all negative. Pt was treated with rocephin and zithromax. Pt came to our office 10/5 and was treated with a course of prednisone.  Pt states rash continues to progress.  He says it itches.  Relevant past medical, surgical, family and social history reviewed and updated as indicated. Interim medical history since our last visit reviewed. Allergies and medications reviewed and updated.  No current outpatient prescriptions on file.    Review of Systems  Constitutional: Negative for fever, chills, diaphoresis, appetite change, fatigue and unexpected weight change.  HENT: Negative for congestion, dental problem, drooling, ear pain, facial swelling, hearing loss, mouth sores, sneezing, sore throat, trouble swallowing and voice change.   Eyes: Negative for pain, discharge, redness, itching and visual disturbance.  Respiratory: Negative for cough, choking, shortness of breath and wheezing.   Cardiovascular: Negative for chest pain, palpitations and leg swelling.  Gastrointestinal: Negative for vomiting, abdominal pain, diarrhea, constipation and blood in stool.  Endocrine: Negative for cold intolerance, heat intolerance and polydipsia.  Genitourinary: Negative for dysuria, hematuria and decreased urine volume.  Musculoskeletal: Negative for back pain, arthralgias and gait problem.  Skin: Positive for rash.  Allergic/Immunologic: Negative for environmental allergies.  Neurological:  Negative for seizures, syncope, light-headedness and headaches.  Hematological: Negative for adenopathy.  Psychiatric/Behavioral: Negative for suicidal ideas, dysphoric mood and agitation. The patient is not nervous/anxious.       Per HPI unless specifically indicated above     Objective:    BP 100/74 mmHg  Pulse 57  Temp(Src) 98.1 F (36.7 C)  Ht 5' 8.25" (1.734 m)  Wt 153 lb 3.2 oz (69.491 kg)  BMI 23.11 kg/m2  SpO2 99%  Wt Readings from Last 3 Encounters:  08/07/15 153 lb 3.2 oz (69.491 kg)  07/09/15 148 lb 12.8 oz (67.495 kg)  06/21/15 153 lb (69.4 kg)    Physical Exam  Constitutional: He appears well-developed and well-nourished.  Skin: Skin is warm and dry. Rash noted. Rash is maculopapular and vesicular.     Majority of rash is maculopapular. There are some vesicles on the penis.  There is some small areas of scabbing versus weeping on the hands.  This is a progression from OV on 10/5 when his hands were spared.  Psychiatric: He has a normal mood and affect. His behavior is normal. Thought content normal.  Vitals reviewed.       Assessment & Plan:     Encounter Diagnosis  Name Primary?  . Rash and nonspecific skin eruption Yes   Refer to dermatologist for further evaluation and treatment

## 2015-09-09 ENCOUNTER — Ambulatory Visit: Payer: Self-pay | Admitting: Physician Assistant

## 2015-09-09 ENCOUNTER — Encounter: Payer: Self-pay | Admitting: Physician Assistant

## 2015-09-09 VITALS — BP 106/68 | HR 54 | Temp 98.8°F | Ht 68.25 in | Wt 150.2 lb

## 2015-09-09 DIAGNOSIS — B86 Scabies: Secondary | ICD-10-CM

## 2015-09-09 MED ORDER — PERMETHRIN 5 % EX CREA: TOPICAL_CREAM | CUTANEOUS | Status: DC

## 2015-09-09 NOTE — Progress Notes (Signed)
   BP 106/68 mmHg  Pulse 54  Temp(Src) 98.8 F (37.1 C)  Ht 5' 8.25" (1.734 m)  Wt 150 lb 3.2 oz (68.13 kg)  BMI 22.66 kg/m2  SpO2 99%   Subjective:    Patient ID: Zachary Cain, male    DOB: 07/29/1996, 19 y.o.   MRN: [id number]  HPI: Zachary Cain is a 19 y.o. male presenting on 09/09/2015 for Rash   HPI  Chief Complaint  Patient presents with  . Rash    recurring rash. pt states he was given cream at derm. pt states it was helpful, but it did not go away completely    Pt was referred to derm and dx with scabies. Pt says his rash improved significantly with the elimite but didn't resolve 100 %  Relevant past medical, surgical, family and social history reviewed and updated as indicated. Interim medical history since our last visit reviewed. Allergies and medications reviewed and updated.  Review of Systems  Constitutional: Negative for fever, chills, diaphoresis, appetite change, fatigue and unexpected weight change.  HENT: Negative for congestion, dental problem, drooling, ear pain, facial swelling, hearing loss, mouth sores, sneezing, sore throat, trouble swallowing and voice change.   Eyes: Negative for pain, discharge, redness, itching and visual disturbance.  Respiratory: Negative for cough, choking, shortness of breath and wheezing.   Cardiovascular: Negative for chest pain, palpitations and leg swelling.  Gastrointestinal: Negative for vomiting, abdominal pain, diarrhea, constipation and blood in stool.  Endocrine: Negative for cold intolerance, heat intolerance and polydipsia.  Genitourinary: Negative for dysuria, hematuria and decreased urine volume.  Musculoskeletal: Negative for back pain, arthralgias and gait problem.  Skin: Positive for rash.  Allergic/Immunologic: Negative for environmental allergies.  Neurological: Negative for seizures, syncope, light-headedness and headaches.  Hematological: Negative for adenopathy.  Psychiatric/Behavioral: Negative for  suicidal ideas, dysphoric mood and agitation. The patient is not nervous/anxious.      Per HPI unless specifically indicated above     Objective:    BP 106/68 mmHg  Pulse 54  Temp(Src) 98.8 F (37.1 C)  Ht 5' 8.25" (1.734 m)  Wt 150 lb 3.2 oz (68.13 kg)  BMI 22.66 kg/m2  SpO2 99%  Wt Readings from Last 3 Encounters:  09/09/15 150 lb 3.2 oz (68.13 kg)  08/07/15 153 lb 3.2 oz (69.491 kg)  07/09/15 148 lb 12.8 oz (67.495 kg)    Physical Exam  Constitutional: He appears well-developed and well-nourished.  Pulmonary/Chest: Effort normal.  Skin: Skin is warm and dry. Rash noted.  Psychiatric: He has a normal mood and affect. His behavior is normal. Thought content normal.    Results for orders placed or performed in visit on 07/09/15  POCT Glucose (CBG)  Result Value Ref Range   POC Glucose 130 (A) 70 - 99 mg/dl      Assessment & Plan:    Encounter Diagnosis  Name Primary?  . Scabies Yes   Pt given another rx for elimite. He is reminded to clean linens, etc. RTO if persists

## 2015-10-02 ENCOUNTER — Ambulatory Visit: Payer: Self-pay | Admitting: Physician Assistant

## 2015-10-02 ENCOUNTER — Encounter: Payer: Self-pay | Admitting: Physician Assistant

## 2015-10-02 VITALS — BP 96/78 | HR 56 | Temp 98.1°F | Ht 68.25 in | Wt 156.0 lb

## 2015-10-02 DIAGNOSIS — R21 Rash and other nonspecific skin eruption: Secondary | ICD-10-CM

## 2015-10-02 NOTE — Progress Notes (Signed)
   BP 96/78 mmHg  Pulse 56  Temp(Src) 98.1 F (36.7 C)  Ht 5' 8.25" (1.734 m)  Wt 156 lb (70.761 kg)  BMI 23.53 kg/m2  SpO2 98%   Subjective:    Patient ID: Zachary Cain, male    DOB: 10/09/1995, 19 y.o.   MRN: [id number]  HPI: Zachary Cain is a 19 y.o. male presenting on 10/02/2015 for Rash   HPI Chief Complaint  Patient presents with  . Rash    posterior aspect of both hands    Pt has been treated twice with elimite.  He says he now has rash nowhere except on hands.  He says the rash on his hands was not improved by the elimite.  Pt is trying to get into the [employer] but they won't get him a date until his hand rash is gone.   Relevant past medical, surgical, family and social history reviewed and updated as indicated. Interim medical history since our last visit reviewed. Allergies and medications reviewed and updated.  No current outpatient prescriptions on file.   Review of Systems  Constitutional: Negative for fever, chills, diaphoresis, appetite change, fatigue and unexpected weight change.  HENT: Negative for congestion, dental problem, drooling, ear pain, facial swelling, hearing loss, mouth sores, sneezing, sore throat, trouble swallowing and voice change.   Eyes: Negative for pain, discharge, redness, itching and visual disturbance.  Respiratory: Negative for cough, choking, shortness of breath and wheezing.   Cardiovascular: Negative for chest pain, palpitations and leg swelling.  Gastrointestinal: Negative for vomiting, abdominal pain, diarrhea, constipation and blood in stool.  Endocrine: Negative for cold intolerance, heat intolerance and polydipsia.  Genitourinary: Negative for dysuria, hematuria and decreased urine volume.  Musculoskeletal: Negative for back pain, arthralgias and gait problem.  Skin: Positive for rash.  Allergic/Immunologic: Negative for environmental allergies.  Neurological: Negative for seizures, syncope, light-headedness and headaches.   Hematological: Negative for adenopathy.  Psychiatric/Behavioral: Negative for suicidal ideas, dysphoric mood and agitation. The patient is not nervous/anxious.       Per HPI unless specifically indicated above     Objective:    BP 96/78 mmHg  Pulse 56  Temp(Src) 98.1 F (36.7 C)  Ht 5' 8.25" (1.734 m)  Wt 156 lb (70.761 kg)  BMI 23.53 kg/m2  SpO2 98%  Wt Readings from Last 3 Encounters:  10/02/15 156 lb (70.761 kg)  09/09/15 150 lb 3.2 oz (68.13 kg)  08/07/15 153 lb 3.2 oz (69.491 kg)    Physical Exam  Constitutional: He is oriented to person, place, and time. He appears well-developed and well-nourished.  HENT:  Head: Normocephalic and atraumatic.  Pulmonary/Chest: Effort normal.  Neurological: He is oriented to person, place, and time.  Skin: Skin is warm and dry. Rash noted.  Dorsal surface B hands with scattered eruption.  Some areas are dry and mildly reddish.  Some areas have small vesicular lesions. Other areas have several pustules.  Some of the lesions are in the webspaces. Palmar surface without visible lesion.  The antecubital fossa, torso, neck are all without visible rash  Psychiatric: He has a normal mood and affect. His behavior is normal. Thought content normal.  Vitals reviewed.       Assessment & Plan:   Encounter Diagnosis  Name Primary?  . Rash and nonspecific skin eruption Yes   Rash appears to be some type of dermatitis.   Pt is referred back to dermatology for definitive diagnosis
